# Patient Record
Sex: Female | Born: 1985 | Race: Black or African American | Hispanic: No | Marital: Single
Health system: Southern US, Community
[De-identification: ages and names within clinical notes are randomized; demographics above are authoritative.]

---

## 2010-01-18 ENCOUNTER — Emergency Department: Payer: Self-pay | Admitting: Emergency Medicine

## 2012-09-26 ENCOUNTER — Emergency Department: Payer: Self-pay | Admitting: Emergency Medicine

## 2012-09-26 LAB — URINALYSIS, COMPLETE
Ketone: NEGATIVE
Leukocyte Esterase: NEGATIVE
Nitrite: NEGATIVE
Protein: 100
RBC,UR: 2907 /HPF (ref 0–5)

## 2012-09-26 LAB — CBC
HCT: 35 % (ref 35.0–47.0)
MCH: 31 pg (ref 26.0–34.0)
MCHC: 33.9 g/dL (ref 32.0–36.0)
Platelet: 287 10*3/uL (ref 150–440)
RBC: 3.83 10*6/uL (ref 3.80–5.20)
RDW: 12.9 % (ref 11.5–14.5)

## 2012-09-26 LAB — GC/CHLAMYDIA PROBE AMP

## 2013-02-26 ENCOUNTER — Ambulatory Visit: Payer: Self-pay | Admitting: Surgery

## 2013-03-25 ENCOUNTER — Ambulatory Visit: Payer: Self-pay | Admitting: Surgery

## 2013-03-25 LAB — CBC WITH DIFFERENTIAL/PLATELET
Basophil #: 0 10*3/uL (ref 0.0–0.1)
Basophil %: 0.5 %
Eosinophil #: 0.8 10*3/uL — ABNORMAL HIGH (ref 0.0–0.7)
HCT: 36.7 % (ref 35.0–47.0)
HGB: 12.4 g/dL (ref 12.0–16.0)
Lymphocyte #: 2.4 10*3/uL (ref 1.0–3.6)
Lymphocyte %: 32.3 %
MCH: 31.1 pg (ref 26.0–34.0)
MCHC: 33.7 g/dL (ref 32.0–36.0)
MCV: 92 fL (ref 80–100)
Monocyte %: 6.6 %
Neutrophil %: 50.4 %
Platelet: 272 10*3/uL (ref 150–440)
RDW: 12.8 % (ref 11.5–14.5)

## 2013-03-31 ENCOUNTER — Ambulatory Visit: Payer: Self-pay | Admitting: Surgery

## 2013-04-02 LAB — PATHOLOGY REPORT

## 2014-08-28 NOTE — Op Note (Signed)
PATIENT NAME:  Kimberly Ellis, Kimberly Ellis MR#:  [id number] DATE OF BIRTH:  11-11-1985  DATE OF PROCEDURE:  03/31/2013  PREOPERATIVE DIAGNOSIS: Right breast mass.   POSTOPERATIVE DIAGNOSIS:  Right breast mass.  SURGEON: Claude MangesWilliam F. , Ellis.D.   ANESTHESIA: General.   OPERATION PERFORMED: Right partial mastectomy.   PROCEDURE IN DETAIL: The patient was placed supine on the operating room table and prepped and draped in the usual sterile fashion. An incision was made in the upper outer quadrant of the right breast fairly laterally overlying the mass, and this was carried down through the skin and subcutaneous tissue with the electrocautery. Breast tissue was then excised surrounding the mass with a healthy margin using the electrocautery. The mass was oriented with marking sutures and the excision was completed and hemostasis was achieved with the electrocautery, and then the skin was closed with a subdermal layer of interrupted 3-0 Monocryl and subcuticular running 5-0 Monocryl and suture strips. A compressive dressing was applied with fluffs, and the patient's own athletic bra completing the procedure. The patient tolerated the procedure well, and there were no complications.     ____________________________ Claude MangesWilliam F. , MD wfm:dmm D: 03/31/2013 12:24:00 ET T: 03/31/2013 12:31:17 ET JOB#: [id number]  cc: Claude MangesWilliam F. , MD, <Dictator> Claude MangesWILLIAM F  MD ELECTRONICALLY SIGNED 04/04/2013 8:43

## 2014-12-18 IMAGING — US ULTRASOUND RIGHT BREAST
1 series · 5 of 5 positions shown · non-contrast
Comparison: None, baseline

CLINICAL DATA: Palpable abnormality in the right breast. Aspiration
was attempted in early [REDACTED], yielding no fluid.

EXAM:
ULTRASOUND RIGHT BREAST

[Series 1: ultrasound right breast · 0.08mm/px · 5 of 5 slices shown]
[im 1/5]
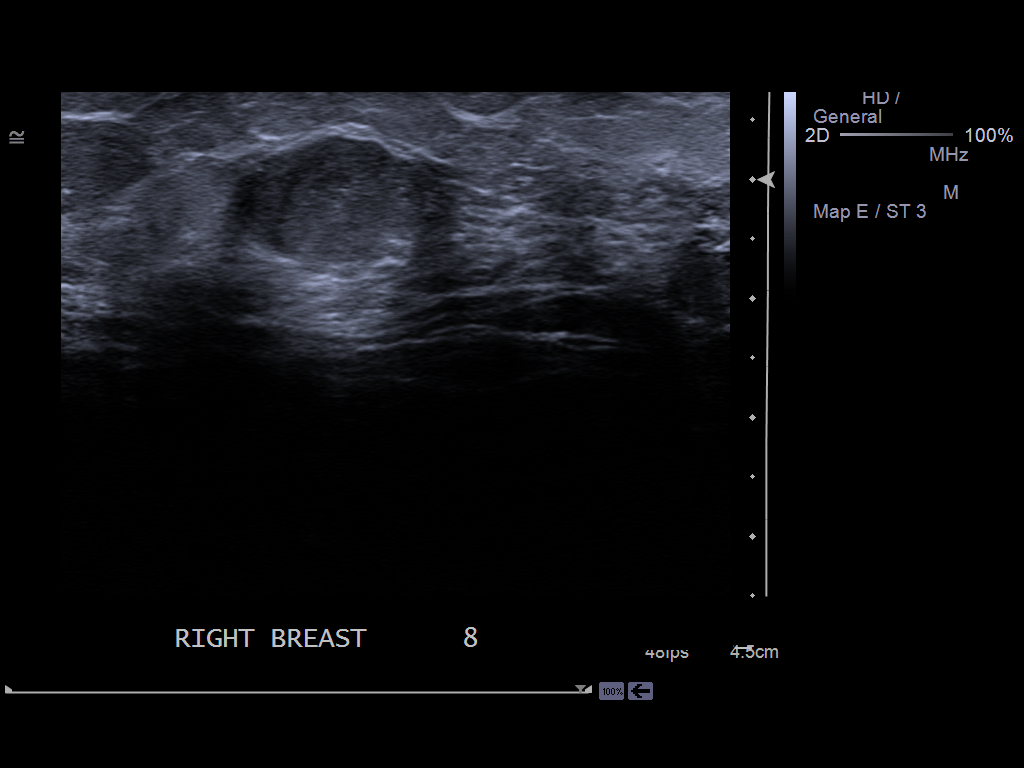
[im 2/5]
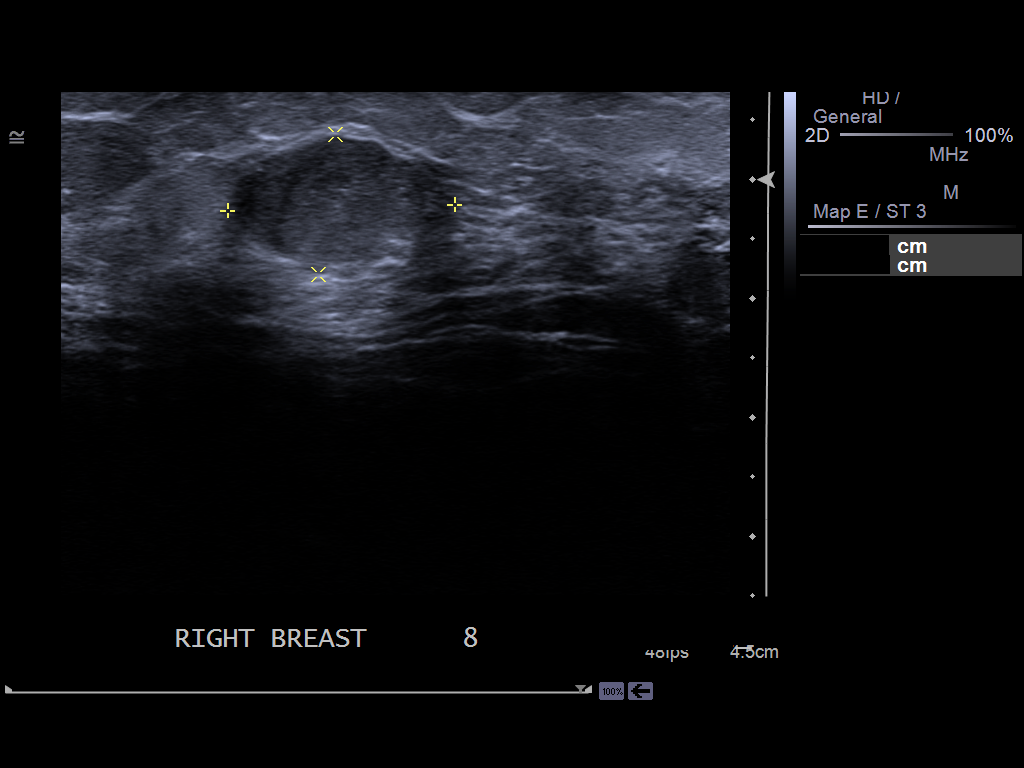
[im 3/5]
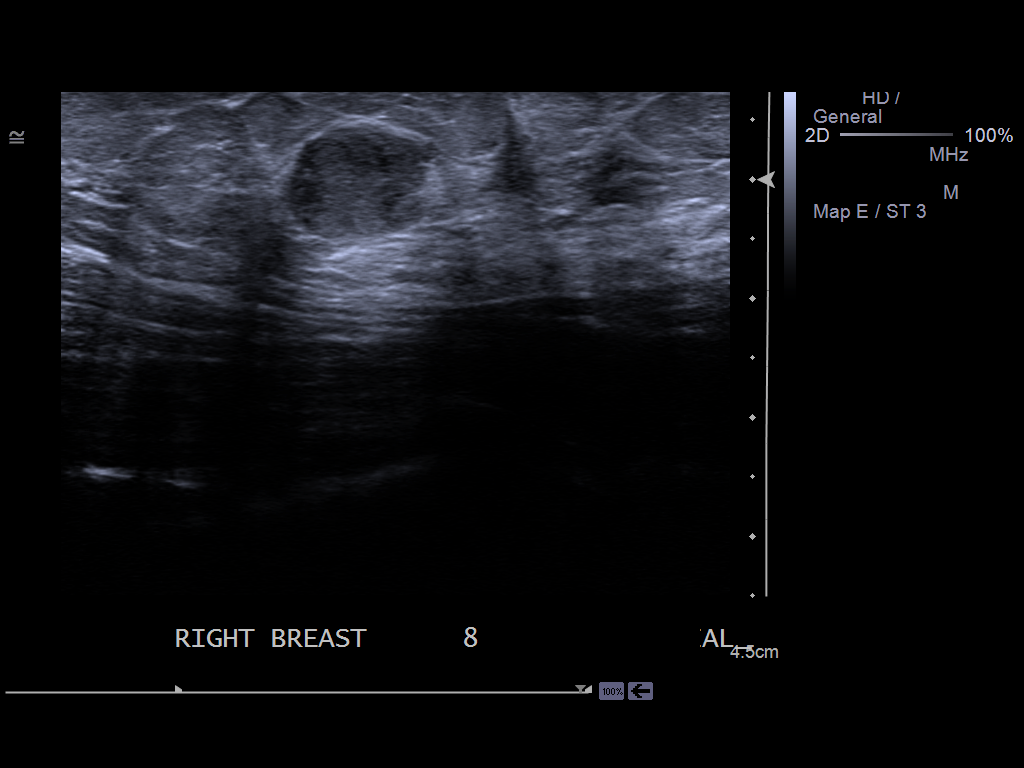
[im 4/5]
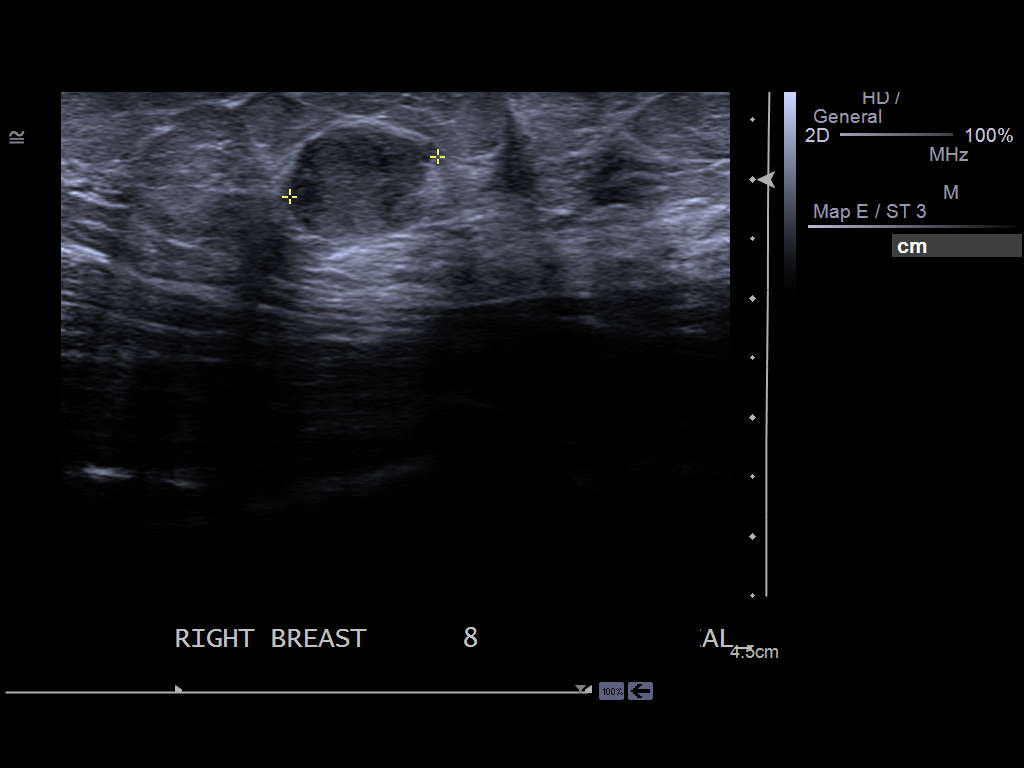
[im 5/5]
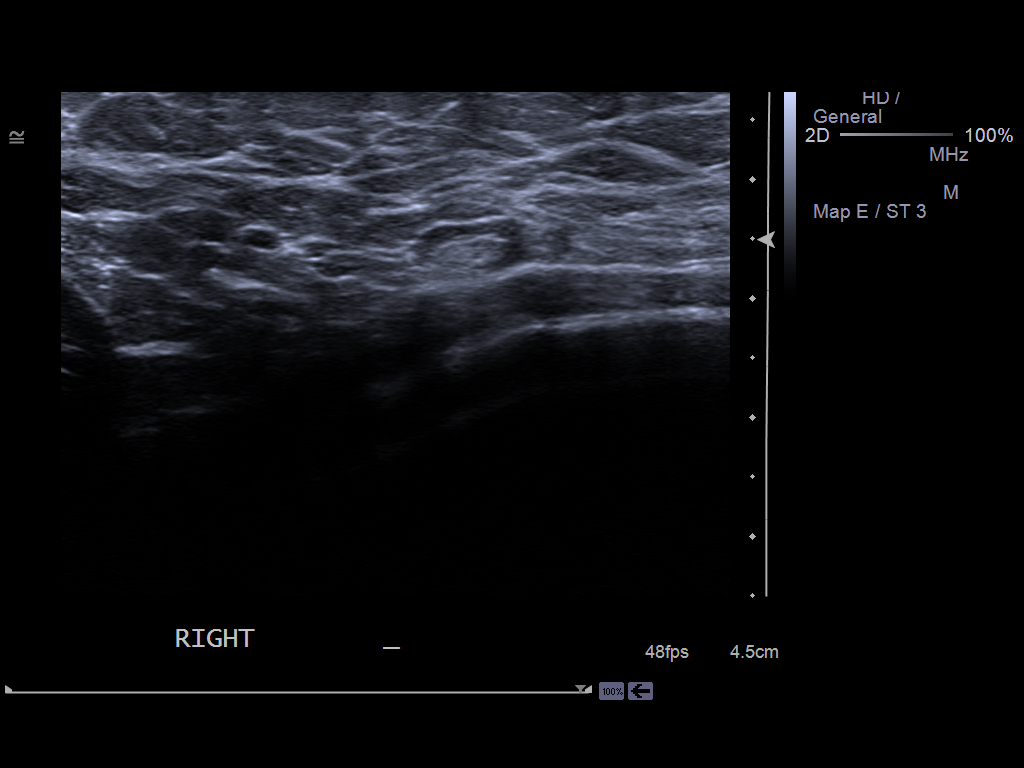

[5 of 5 positions shown; findings below may reference images not displayed]

FINDINGS: On physical exam,there is a rounded mobile mass within the 10-11
o'clock location of the right breast..

Ultrasound is performed, showing an oval hypoechoic circumscribed
nodule in the 10-11 o'clock location of the right breast, 8 from the
nipple. This shows increased through transmission and circumscribed
margins, consistent with benign fibroadenoma.. Nodule measures 1.3 x
1.9 x 1.2 cm. Evaluation of the right axilla shows no adenopathy.
IMPRESSION: 1. Benign-appearing mass in the right breast, most consistent with
benign fibroadenoma.
2. We discussed management options including excision,
ultrasound-guided core biopsy, and close follow-up. The patient is
considering excision, as the mass is painful. She has a followup
appointment with Dr. Chai.
3. If excision is not performed, I would suggest followup ultrasound
in 6, 12, and 24 months to assess stability.

RECOMMENDATION:
Surgical consultation to discuss options

I have discussed the findings and recommendations with the patient.
Results were also provided in writing at the conclusion of the
visit. If applicable, a reminder letter will be sent to the patient
regarding the next appointment.

BI-RADS CATEGORY  3: Probably benign finding(s) - short interval
follow-up suggested.

## 2015-10-13 ENCOUNTER — Ambulatory Visit: Admit: 2015-10-13 | Discharge: 2015-10-13 | Payer: BLUE CROSS/BLUE SHIELD | Attending: Family | Primary: Family

## 2015-10-13 DIAGNOSIS — K580 Irritable bowel syndrome with diarrhea: Secondary | ICD-10-CM

## 2015-10-13 MED ORDER — SUMATRIPTAN 50 MG TAB
50 mg | ORAL_TABLET | Freq: Once | ORAL | 1 refills | Status: AC | PRN
Start: 2015-10-13 — End: ?

## 2015-10-13 MED ORDER — AMITRIPTYLINE 10 MG TAB
10 mg | ORAL_TABLET | ORAL | 1 refills | Status: AC
Start: 2015-10-13 — End: ?

## 2015-10-13 MED ORDER — DICYCLOMINE 10 MG CAP
10 mg | ORAL_CAPSULE | Freq: Four times a day (QID) | ORAL | 1 refills | Status: AC
Start: 2015-10-13 — End: ?

## 2015-10-13 NOTE — Progress Notes (Signed)
HISTORY OF PRESENT ILLNESS  Jodi Wagner is a 30 y.o. female. Here to get established as a new pt.  Has migraines has had since 24. It starts with blurred vision then gets a headache instantly. The headache last for hours. She has in the past taken tylenol extra strength and Excedrin. Has also tried Advil. She gets the headaches nearly everyday for the past several months. 5/7 days. Has a headache. She sleeps ok. Has a 796 months old is not breast feeding. Blurred vision lasts 5 min . Sleep helps her headaches. Has some nausea at times. Also has photophobia at times.   Also has stomach issues. She has Bm after eating immediately . Almost has an accident. No weight loss no fever. She at times has accidents and incontinence of Bm's.    Marland Kitchen.   HPI    Review of Systems   Constitutional: Negative for weight loss.   Eyes: Positive for blurred vision and photophobia.   Gastrointestinal: Positive for diarrhea and nausea.   Neurological: Positive for headaches.       Physical Exam   Constitutional: She is oriented to person, place, and time. She appears well-developed and well-nourished.   HENT:   Head: Normocephalic.   Cardiovascular: Normal rate, regular rhythm and normal heart sounds.    Pulmonary/Chest: Effort normal and breath sounds normal.   Musculoskeletal: Normal range of motion.   Neurological: She is alert and oriented to person, place, and time.   Skin: Skin is warm and dry.   Psychiatric: She has a normal mood and affect. Her behavior is normal.   Nursing note and vitals reviewed.    BP 91/63 (BP 1 Location: Left arm, BP Patient Position: Sitting)   Pulse (!) 130   Temp 98.6 ??F (37 ??C) (Oral)    Ht 4' 11.5" (1.511 m)   Wt 184 lb (83.5 kg)   LMP 06/09/2015   SpO2 99%   BMI 36.54 kg/m2  ASSESSMENT and PLAN    ICD-10-CM ICD-9-CM    1. 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 Migraine with aura and without status migrainosus, not intractable  G43.109 346.00 amitriptyline (ELAVIL) 10 mg tablet      SUMAtriptan (IMITREX) 50 mg tablet   Given elavil for prevention of migraines and Imitrex for rescue. Is to try bentyl prn for stomach cramping an dumping. Is to follow up in 2 weeks

## 2015-10-27 ENCOUNTER — Encounter: Payer: BLUE CROSS/BLUE SHIELD | Attending: Family | Primary: Family
